# Patient Record
Sex: Male | Born: 1940 | Race: White | Hispanic: No | Marital: Married | Smoking: Never smoker
Health system: Southern US, Community
[De-identification: ages and names within clinical notes are randomized; demographics above are authoritative.]

## PROBLEM LIST (undated history)

## (undated) DIAGNOSIS — E119 Type 2 diabetes mellitus without complications: Secondary | ICD-10-CM

## (undated) DIAGNOSIS — I10 Essential (primary) hypertension: Secondary | ICD-10-CM

## (undated) DIAGNOSIS — J449 Chronic obstructive pulmonary disease, unspecified: Secondary | ICD-10-CM

## (undated) DIAGNOSIS — N189 Chronic kidney disease, unspecified: Secondary | ICD-10-CM

## (undated) DIAGNOSIS — M1990 Unspecified osteoarthritis, unspecified site: Secondary | ICD-10-CM

## (undated) DIAGNOSIS — E785 Hyperlipidemia, unspecified: Secondary | ICD-10-CM

## (undated) DIAGNOSIS — D649 Anemia, unspecified: Secondary | ICD-10-CM

## (undated) DIAGNOSIS — I739 Peripheral vascular disease, unspecified: Secondary | ICD-10-CM

## (undated) HISTORY — PX: HIP FUSION: SHX669

## (undated) HISTORY — PX: LUMBAR FUSION: SHX111

---

## 2003-07-23 ENCOUNTER — Other Ambulatory Visit: Payer: Self-pay

## 2004-04-26 ENCOUNTER — Other Ambulatory Visit: Payer: Self-pay

## 2009-06-13 ENCOUNTER — Ambulatory Visit: Payer: Self-pay | Admitting: Internal Medicine

## 2011-08-05 ENCOUNTER — Ambulatory Visit: Payer: Self-pay | Admitting: Internal Medicine

## 2012-11-17 ENCOUNTER — Ambulatory Visit: Payer: Self-pay | Admitting: Internal Medicine

## 2014-01-27 ENCOUNTER — Inpatient Hospital Stay: Payer: Self-pay | Admitting: Internal Medicine

## 2014-01-27 LAB — COMPREHENSIVE METABOLIC PANEL
ALBUMIN: 3 g/dL — AB (ref 3.4–5.0)
ALT: 18 U/L (ref 12–78)
ANION GAP: 9 (ref 7–16)
Alkaline Phosphatase: 96 U/L
BUN: 31 mg/dL — AB (ref 7–18)
Bilirubin,Total: 1.4 mg/dL — ABNORMAL HIGH (ref 0.2–1.0)
CHLORIDE: 100 mmol/L (ref 98–107)
CREATININE: 2.63 mg/dL — AB (ref 0.60–1.30)
Calcium, Total: 8.7 mg/dL (ref 8.5–10.1)
Co2: 24 mmol/L (ref 21–32)
EGFR (African American): 27 — ABNORMAL LOW
GFR CALC NON AF AMER: 23 — AB
Glucose: 98 mg/dL (ref 65–99)
OSMOLALITY: 273 (ref 275–301)
Potassium: 3.5 mmol/L (ref 3.5–5.1)
SGOT(AST): 27 U/L (ref 15–37)
Sodium: 133 mmol/L — ABNORMAL LOW (ref 136–145)
Total Protein: 7.7 g/dL (ref 6.4–8.2)

## 2014-01-27 LAB — URINALYSIS, COMPLETE
BLOOD: NEGATIVE
Bacteria: NONE SEEN
Bilirubin,UR: NEGATIVE
Glucose,UR: NEGATIVE mg/dL (ref 0–75)
Hyaline Cast: 7
Ketone: NEGATIVE
LEUKOCYTE ESTERASE: NEGATIVE
NITRITE: NEGATIVE
Ph: 5 (ref 4.5–8.0)
Protein: 30
RBC,UR: 5 /HPF (ref 0–5)
Specific Gravity: 1.021 (ref 1.003–1.030)
Squamous Epithelial: NONE SEEN
WBC UR: 1 /HPF (ref 0–5)

## 2014-01-27 LAB — CBC
HCT: 38 % — AB (ref 40.0–52.0)
HGB: 12.4 g/dL — AB (ref 13.0–18.0)
MCH: 29 pg (ref 26.0–34.0)
MCHC: 32.7 g/dL (ref 32.0–36.0)
MCV: 89 fL (ref 80–100)
Platelet: 203 10*3/uL (ref 150–440)
RBC: 4.28 10*6/uL — ABNORMAL LOW (ref 4.40–5.90)
RDW: 16 % — ABNORMAL HIGH (ref 11.5–14.5)
WBC: 24.5 10*3/uL — AB (ref 3.8–10.6)

## 2014-01-27 LAB — TROPONIN I: TROPONIN-I: 0.03 ng/mL

## 2014-01-28 LAB — CBC WITH DIFFERENTIAL/PLATELET
BASOS PCT: 0.3 %
Basophil #: 0.1 10*3/uL (ref 0.0–0.1)
EOS PCT: 2.6 %
Eosinophil #: 0.5 10*3/uL (ref 0.0–0.7)
HCT: 34 % — ABNORMAL LOW (ref 40.0–52.0)
HGB: 11.1 g/dL — AB (ref 13.0–18.0)
LYMPHS PCT: 7.1 %
Lymphocyte #: 1.4 10*3/uL (ref 1.0–3.6)
MCH: 29.1 pg (ref 26.0–34.0)
MCHC: 32.7 g/dL (ref 32.0–36.0)
MCV: 89 fL (ref 80–100)
MONO ABS: 2.1 x10 3/mm — AB (ref 0.2–1.0)
Monocyte %: 10.6 %
NEUTROS PCT: 79.4 %
Neutrophil #: 16 10*3/uL — ABNORMAL HIGH (ref 1.4–6.5)
PLATELETS: 157 10*3/uL (ref 150–440)
RBC: 3.82 10*6/uL — ABNORMAL LOW (ref 4.40–5.90)
RDW: 15.9 % — AB (ref 11.5–14.5)
WBC: 20.1 10*3/uL — ABNORMAL HIGH (ref 3.8–10.6)

## 2014-01-28 LAB — BASIC METABOLIC PANEL
ANION GAP: 7 (ref 7–16)
BUN: 37 mg/dL — ABNORMAL HIGH (ref 7–18)
CALCIUM: 7.9 mg/dL — AB (ref 8.5–10.1)
CHLORIDE: 103 mmol/L (ref 98–107)
CO2: 24 mmol/L (ref 21–32)
Creatinine: 2.85 mg/dL — ABNORMAL HIGH (ref 0.60–1.30)
EGFR (African American): 24 — ABNORMAL LOW
EGFR (Non-African Amer.): 21 — ABNORMAL LOW
Glucose: 119 mg/dL — ABNORMAL HIGH (ref 65–99)
OSMOLALITY: 278 (ref 275–301)
POTASSIUM: 3.4 mmol/L — AB (ref 3.5–5.1)
SODIUM: 134 mmol/L — AB (ref 136–145)

## 2014-01-29 LAB — BASIC METABOLIC PANEL
Anion Gap: 7 (ref 7–16)
BUN: 39 mg/dL — AB (ref 7–18)
CALCIUM: 7.9 mg/dL — AB (ref 8.5–10.1)
Chloride: 103 mmol/L (ref 98–107)
Co2: 24 mmol/L (ref 21–32)
Creatinine: 2.5 mg/dL — ABNORMAL HIGH (ref 0.60–1.30)
GFR CALC AF AMER: 28 — AB
GFR CALC NON AF AMER: 25 — AB
GLUCOSE: 77 mg/dL (ref 65–99)
OSMOLALITY: 276 (ref 275–301)
POTASSIUM: 4.1 mmol/L (ref 3.5–5.1)
Sodium: 134 mmol/L — ABNORMAL LOW (ref 136–145)

## 2014-01-29 LAB — CBC WITH DIFFERENTIAL/PLATELET
Bands: 1 %
Comment - H1-Com2: NORMAL
EOS PCT: 17 %
HCT: 33.4 % — ABNORMAL LOW (ref 40.0–52.0)
HGB: 10.8 g/dL — ABNORMAL LOW (ref 13.0–18.0)
LYMPHS PCT: 9 %
MCH: 29.2 pg (ref 26.0–34.0)
MCHC: 32.5 g/dL (ref 32.0–36.0)
MCV: 90 fL (ref 80–100)
METAMYELOCYTE: 1 %
MONOS PCT: 7 %
Platelet: 161 10*3/uL (ref 150–440)
RBC: 3.72 10*6/uL — ABNORMAL LOW (ref 4.40–5.90)
RDW: 15.9 % — ABNORMAL HIGH (ref 11.5–14.5)
Segmented Neutrophils: 65 %
WBC: 13.5 10*3/uL — ABNORMAL HIGH (ref 3.8–10.6)

## 2014-01-30 LAB — BASIC METABOLIC PANEL
Anion Gap: 10 (ref 7–16)
BUN: 36 mg/dL — AB (ref 7–18)
Calcium, Total: 8.1 mg/dL — ABNORMAL LOW (ref 8.5–10.1)
Chloride: 105 mmol/L (ref 98–107)
Co2: 21 mmol/L (ref 21–32)
Creatinine: 2.48 mg/dL — ABNORMAL HIGH (ref 0.60–1.30)
EGFR (African American): 29 — ABNORMAL LOW
EGFR (Non-African Amer.): 25 — ABNORMAL LOW
Glucose: 64 mg/dL — ABNORMAL LOW (ref 65–99)
Osmolality: 278 (ref 275–301)
POTASSIUM: 4.6 mmol/L (ref 3.5–5.1)
SODIUM: 136 mmol/L (ref 136–145)

## 2014-01-30 LAB — CBC WITH DIFFERENTIAL/PLATELET
Basophil #: 0 10*3/uL (ref 0.0–0.1)
Basophil %: 0.5 %
EOS PCT: 20.5 %
Eosinophil #: 2 10*3/uL — ABNORMAL HIGH (ref 0.0–0.7)
HCT: 33.1 % — ABNORMAL LOW (ref 40.0–52.0)
HGB: 10.7 g/dL — AB (ref 13.0–18.0)
Lymphocyte #: 0.9 10*3/uL — ABNORMAL LOW (ref 1.0–3.6)
Lymphocyte %: 8.7 %
MCH: 29.1 pg (ref 26.0–34.0)
MCHC: 32.3 g/dL (ref 32.0–36.0)
MCV: 90 fL (ref 80–100)
MONOS PCT: 10.2 %
Monocyte #: 1 x10 3/mm (ref 0.2–1.0)
Neutrophil #: 5.9 10*3/uL (ref 1.4–6.5)
Neutrophil %: 60.1 %
PLATELETS: 153 10*3/uL (ref 150–440)
RBC: 3.67 10*6/uL — ABNORMAL LOW (ref 4.40–5.90)
RDW: 16.1 % — ABNORMAL HIGH (ref 11.5–14.5)
WBC: 9.8 10*3/uL (ref 3.8–10.6)

## 2014-01-31 LAB — BASIC METABOLIC PANEL
ANION GAP: 7 (ref 7–16)
BUN: 32 mg/dL — AB (ref 7–18)
CHLORIDE: 108 mmol/L — AB (ref 98–107)
CO2: 23 mmol/L (ref 21–32)
Calcium, Total: 8.4 mg/dL — ABNORMAL LOW (ref 8.5–10.1)
Creatinine: 2.09 mg/dL — ABNORMAL HIGH (ref 0.60–1.30)
EGFR (African American): 35 — ABNORMAL LOW
EGFR (Non-African Amer.): 30 — ABNORMAL LOW
Glucose: 97 mg/dL (ref 65–99)
OSMOLALITY: 282 (ref 275–301)
POTASSIUM: 4.4 mmol/L (ref 3.5–5.1)
Sodium: 138 mmol/L (ref 136–145)

## 2014-01-31 LAB — CBC WITH DIFFERENTIAL/PLATELET
Basophil #: 0.1 10*3/uL (ref 0.0–0.1)
Basophil %: 1.1 %
EOS ABS: 1.9 10*3/uL — AB (ref 0.0–0.7)
Eosinophil %: 21.8 %
HCT: 31.7 % — ABNORMAL LOW (ref 40.0–52.0)
HGB: 10.5 g/dL — ABNORMAL LOW (ref 13.0–18.0)
LYMPHS PCT: 11.4 %
Lymphocyte #: 1 10*3/uL (ref 1.0–3.6)
MCH: 29.6 pg (ref 26.0–34.0)
MCHC: 33.2 g/dL (ref 32.0–36.0)
MCV: 89 fL (ref 80–100)
MONO ABS: 1 x10 3/mm (ref 0.2–1.0)
MONOS PCT: 11.5 %
NEUTROS ABS: 4.8 10*3/uL (ref 1.4–6.5)
Neutrophil %: 54.2 %
Platelet: 163 10*3/uL (ref 150–440)
RBC: 3.54 10*6/uL — AB (ref 4.40–5.90)
RDW: 16 % — ABNORMAL HIGH (ref 11.5–14.5)
WBC: 8.8 10*3/uL (ref 3.8–10.6)

## 2014-02-01 LAB — BASIC METABOLIC PANEL
ANION GAP: 6 — AB (ref 7–16)
BUN: 25 mg/dL — ABNORMAL HIGH (ref 7–18)
CALCIUM: 8.8 mg/dL (ref 8.5–10.1)
Chloride: 110 mmol/L — ABNORMAL HIGH (ref 98–107)
Co2: 23 mmol/L (ref 21–32)
Creatinine: 1.83 mg/dL — ABNORMAL HIGH (ref 0.60–1.30)
EGFR (African American): 41 — ABNORMAL LOW
GFR CALC NON AF AMER: 36 — AB
GLUCOSE: 104 mg/dL — AB (ref 65–99)
OSMOLALITY: 282 (ref 275–301)
POTASSIUM: 4.3 mmol/L (ref 3.5–5.1)
SODIUM: 139 mmol/L (ref 136–145)

## 2014-02-01 LAB — CBC WITH DIFFERENTIAL/PLATELET
Bands: 3 %
EOS PCT: 20 %
HCT: 31.7 % — AB (ref 40.0–52.0)
HGB: 10.3 g/dL — AB (ref 13.0–18.0)
LYMPHS PCT: 14 %
MCH: 29.4 pg (ref 26.0–34.0)
MCHC: 32.6 g/dL (ref 32.0–36.0)
MCV: 90 fL (ref 80–100)
Monocytes: 8 %
Myelocyte: 2 %
Platelet: 201 10*3/uL (ref 150–440)
RBC: 3.52 10*6/uL — ABNORMAL LOW (ref 4.40–5.90)
RDW: 15.9 % — ABNORMAL HIGH (ref 11.5–14.5)
SEGMENTED NEUTROPHILS: 53 %
WBC: 9.6 10*3/uL (ref 3.8–10.6)

## 2014-02-01 LAB — CULTURE, BLOOD (SINGLE)

## 2014-02-02 LAB — BASIC METABOLIC PANEL
ANION GAP: 6 — AB (ref 7–16)
BUN: 21 mg/dL — AB (ref 7–18)
CHLORIDE: 109 mmol/L — AB (ref 98–107)
CO2: 24 mmol/L (ref 21–32)
Calcium, Total: 9.2 mg/dL (ref 8.5–10.1)
Creatinine: 1.76 mg/dL — ABNORMAL HIGH (ref 0.60–1.30)
EGFR (African American): 43 — ABNORMAL LOW
EGFR (Non-African Amer.): 38 — ABNORMAL LOW
Glucose: 100 mg/dL — ABNORMAL HIGH (ref 65–99)
Osmolality: 281 (ref 275–301)
POTASSIUM: 4.2 mmol/L (ref 3.5–5.1)
Sodium: 139 mmol/L (ref 136–145)

## 2014-02-02 LAB — CBC WITH DIFFERENTIAL/PLATELET
Bands: 1 %
Comment - H1-Com2: NORMAL
Eosinophil: 29 %
HCT: 33.5 % — ABNORMAL LOW (ref 40.0–52.0)
HGB: 10.9 g/dL — ABNORMAL LOW (ref 13.0–18.0)
Lymphocytes: 18 %
MCH: 29.1 pg (ref 26.0–34.0)
MCHC: 32.4 g/dL (ref 32.0–36.0)
MCV: 90 fL (ref 80–100)
METAMYELOCYTE: 1 %
MONOS PCT: 7 %
PLATELETS: 253 10*3/uL (ref 150–440)
RBC: 3.73 10*6/uL — AB (ref 4.40–5.90)
RDW: 16.1 % — ABNORMAL HIGH (ref 11.5–14.5)
SEGMENTED NEUTROPHILS: 44 %
WBC: 10.4 10*3/uL (ref 3.8–10.6)

## 2014-11-08 ENCOUNTER — Ambulatory Visit: Payer: Self-pay | Admitting: Internal Medicine

## 2015-01-06 NOTE — H&P (Signed)
PATIENT NAME:  Tyler Bowers, Tyler Bowers MR#:  [id number] DATE OF BIRTH:  [birth year]  DATE OF ADMISSION:  01/27/2014  PRIMARY CARE PHYSICIAN: Dr. Aram BeechamJeffrey Sparks.   CHIEF COMPLAINT: Status post fall and back pain.   HISTORY OF PRESENT ILLNESS: This is a 74 year old male who presented to the hospital after he had a fall earlier at home today and was having some lower back pain. The patient says that he also gets physical therapy as an outpatient for gait training. He went there yesterday and when he was returning from there, he felt sick to his stomach and had some nausea, vomiting. He has not had any since then, but he has had poor appetite now for the past couple of days. He then have a fall earlier this morning, which was likely a mechanical fall. He was also complaining of some shortness of breath; therefore, his family brought him to the Emergency Room. In the Emergency Room, the patient was noted to have a leukocytosis and noted to be in acute renal failure. His CT chest was suggestive of a left lower lobe pneumonia. Hospitalist services are contacted for further treatment and evaluation.   REVIEW OF SYSTEMS: GENERAL: No documented fever. Positive fatigue. No weight gain. No weight loss.  EYES: No blurred or double vision.  EARS, NOSE, THROAT: No tinnitus. No postnasal drip. No redness of the oropharynx.  RESPIRATORY: Positive cough, productive of clear sputum. No wheeze. No hemoptysis. Positive dyspnea.  CARDIOVASCULAR: No chest pain. No orthopnea. No palpitations. No syncope.  GASTROINTESTINAL: Positive nausea and vomiting. No diarrhea. No abdominal pain. No melena or hematochezia.  GENITOURINARY: No dysuria or hematuria.  ENDOCRINE: No polyuria, nocturia, heat or cold intolerance.  HEMATOLOGIC: No anemia. No bruising. No bleeding.  INTEGUMENTARY: No rashes. No lesions.  MUSCULOSKELETAL: No arthritis, no swelling, no gout.  NEUROLOGIC: No numbness. No tingling. No ataxia. No seizure-type activity.   PSYCHIATRIC: No anxiety. No insomnia. No ADD.   PAST MEDICAL HISTORY: Consistent with diabetes, hypertension, COPD, GERD, hyperlipidemia, BPH, history of gout.   ALLERGIES: No known drug allergies.   SOCIAL HISTORY: Used to be a smoker, quit about 8 years ago, did smoke for the past 40 to 50 years. Occasional alcohol use. No illicit drug abuse. Lives at home with his wife.   FAMILY HISTORY: The patient's mother and father are both deceased. Father died from an MI. Mother died from complications of diabetes.   CURRENT MEDICATIONS: Tylenol 650 at bedtime as needed, allopurinol 100 mg daily, Coreg 12.5 mg b.i.d., chlorthalidone 25 mg daily, cholecalciferol 2000 international units daily, Plavix 75 mg daily, iron sulfate 325 mg b.i.d., glipizide 10 mg b.i.d., hydralazine 100 mg t.i.d., ipratropium nasal spray 2 sprays to each nostril daily at bedtime, omeprazole 20 mg daily, Pravachol 20 mg daily, and terazosin 2 mg at bedtime.   PHYSICAL EXAMINATION: Presently is as follows:  VITAL SIGNS: Temperature 99.4, pulse 82, respirations 20, blood pressure 109/52, sats 92% on room air.  GENERAL: He is a pleasant-appearing male in no apparent distress.  HEAD, EYES, EARS, NOSE, AND THROAT: Atraumatic, normocephalic. Extraocular muscles are intact. Pupils equal and reactive to light. Sclerae anicteric. No conjunctival injection. No pharyngeal erythema.  NECK: Supple. There is no jugular venous distention. No bruits, no lymphadenopathy, no thyromegaly.  HEART: Regular rate and rhythm, tachycardic. No murmurs, no rubs, no clicks.  LUNGS: He has some coarse rhonchi and end-expiratory wheezing, but negative use of accessory muscles. No dullness to percussion. No rales.  ABDOMEN: Soft,  flat, nontender, nondistended. Has good bowel sounds. No hepatosplenomegaly appreciated.  EXTREMITIES: No evidence of any cyanosis, clubbing, or peripheral edema. Has +2 pedal and radial pulses bilaterally.  NEUROLOGICAL: The  patient is alert, awake, and oriented x 3 with no focal motor or sensory deficits appreciated bilaterally.  SKIN: Moist and warm with no rashes appreciated.  LYMPHATIC: There is no cervical or axillary lymphadenopathy.   LABORATORY AND RADIOLOGICAL DATA: Serum glucose of 98, BUN 31, creatinine 2.6, sodium 133, potassium 3.5, chloride 100, bicarbonate 24. Albumin is 3.0, LFTs within normal limits. Troponin 0.03. White cell count 24.5, hemoglobin 12.4, hematocrit 38, platelet count 203. The patient's urinalysis is within normal limits.   The patient did have a chest x-ray done, which showed stable chronic interstitial coarsening, no acute cardiopulmonary disease, low lung volumes. X-ray of the lumbar spine shows  mild anterior wedging of the L1-L2 vertebral bodies, osteoarthritic changes at L5-S1, no spondylolisthesis. The patient also had a CT scan of the chest done without contrast, which showed a dense left lower lobe consolidation suggesting pneumonia given the history of shortness of breath. Given the presence of subpleural reticular scarring elsewhere, round atelectasis is theoretically possible but less likely. Bilateral pulmonary nodules as above, one of which is superior segment of right lower lobe, could be associated with scarring. If the patient is at high risk for bronchogenic carcinoma, a followup CT chest at 3 to 6 months is recommended.   ASSESSMENT AND PLAN: This is a 74 year old male with a history of chronic obstructive pulmonary disease, diabetes, hypertension, gastroesophageal reflux disease, hyperlipidemia, benign prostatic hypertrophy, history of gout, presented to the hospital due to a fall and back pain, noted to have a leukocytosis with acute renal failure and left lower lobe pneumonia.  1.  Left lower lobe pneumonia: This is likely the cause of the patient's shortness of breath. This is likely community-acquired pneumonia. I will treat the patient with IV ceftriaxone and Zithromax,  follow sputum and blood cultures, and follow him clinically.  2.  Acute renal failure: Likely secondary to dehydration and poor p.o. intake. I will hydrate him with IV fluids, follow BUN and creatinine, hold his chlorthalidone.  3.  Leukocytosis: I suspect this is secondary to the pneumonia. I will follow his white cell count after IV antibiotic therapy.  4.  Back pain status post fall: The patient's x-ray shows no evidence of acute fracture. Continue supportive care with pain control with some p.r.n. Norco.  5.  Benign prostatic hypertrophy: Continue terazosin. There is no urinary retention or obstruction.  6.  Hypertension: The patient is hemodynamically stable. I will continue his hydralazine and Coreg.  7.  Hyperlipidemia: Continue with his Pravachol.  8.  Gout: No acute attack. Hold his allopurinol given his acute renal failure. 9.  Gastroesophageal reflux disease: Continue Protonix.  10.  History of peripheral vascular disease: Continue with his statin and Plavix.  11.  Diabetes: Continue with his glipizide and a carbohydrate-controlled diet. Follow blood sugars.   CODE STATUS: The patient is a full code.   TIME SPENT: 50 minutes.    ____________________________ Rolly Pancake. Cherlynn Kaiser, MD vjs:jcm D: 01/27/2014 19:02:54 ET T: 01/27/2014 19:38:31 ET JOB#: [id number]  cc: Rolly Pancake. Cherlynn Kaiser, MD, <Dictator> Houston Siren MD ELECTRONICALLY SIGNED 02/08/2014 15:08

## 2015-01-06 NOTE — Discharge Summary (Signed)
PATIENT NAME:  Tyler Bowers, [name] MR#:  [id number] DATE OF BIRTH:  Jul 27, 1941  DATE OF ADMISSION:  01/27/2014 DATE OF DISCHARGE:  02/02/2014  REASON FOR ADMISSION: Fall with back pain.   HISTORY OF PRESENT ILLNESS: Please see the dictated HPI done by Dr. Cherlynn KaiserSainani on 01/27/2014.   PAST MEDICAL HISTORY:  1. Peripheral vascular disease.  2. Type 2 diabetes.  3. Benign hypertension.  4. COPD.  5. GE reflux disease.  6. Hyperlipidemia.  7. Gout.  8. BPH.   MEDICATIONS ON ADMISSION: Please see admission note.   ALLERGIES: No known drug allergies.   SOCIAL HISTORY, FAMILY HISTORY AND REVIEW OF SYSTEMS: As per admission note.   PHYSICAL EXAMINATION:  GENERAL: The patient was in no acute distress.  VITAL SIGNS: Remarkable for a blood pressure of 109/52 with a temperature of 99.4.  HEENT: Unremarkable.  NECK: Supple, without JVD.  LUNGS: Revealed diffuse wheezes and rhonchi.  CARDIAC: Revealed a regular rate and rhythm. Normal S1, S2.  ABDOMEN: Soft and nontender.  EXTREMITIES: Without edema.  NEUROLOGIC: Grossly nonfocal.   HOSPITAL COURSE: The patient was admitted with pneumonia associated with acute renal failure and dehydration. The patient was placed on IV fluids with IV antibiotics with oxygen and SVNs. His leukocytosis improved. He continued to have back pain. No acute fracture on plain films. He was seen by physical therapy. The patient improved with conservative measures. He was weaned off oxygen. He was switched to oral antibiotics and continued to do well. By 02/02/2014, the patient was stable and ready for discharge.   DISCHARGE DIAGNOSES:  1. Community-acquired pneumonia.  2. Chronic anemia.  3. Acute on chronic renal failure.  4. Dehydration.  5. Type 2 diabetes.  6. Chronic obstructive pulmonary disease.  7. Degenerative disk disease with back pain.   DISCHARGE MEDICATIONS:  1. Chlorthalidone 25 mg p.o. daily.  2. Terazosin 10 mg p.o. at bedtime.  3. Glipizide 10  mg p.o. daily.  4. Pravastatin 20 mg p.o. at bedtime.  5. Allopurinol 100 mg p.o. daily.  6. Plavix 75 mg p.o. daily.  7. Coreg 12.5 mg p.o. b.i.d.  8. Ceftin 250 mg p.o. b.i.d. for 10 days.  9. Iron sulfate 325 mg p.o. b.i.d.  10. Zithromax 250 mg p.o. daily for 5 days.  11. Protonix 40 mg p.o. daily.  12. Atrovent nasal spray 2 puffs in each nostril at bedtime.  13. Hydralazine 50 mg p.o. b.i.d.  14. Vitamin D 2000 units p.o. daily.   FOLLOW-UP PLANS AND APPOINTMENTS: The patient was discharged on a low-sodium, carbohydrate-controlled diet. He will follow up with me within 1 week's time, sooner if needed.   ____________________________ Duane LopeJeffrey D. Judithann SheenSparks, MD jds:lb D: 02/17/2014 12:38:52 ET T: 02/17/2014 13:04:40 ET JOB#: [id number]  cc: Duane LopeJeffrey D. Judithann SheenSparks, MD, <Dictator> Janeane Cozart Rodena Medin Miela Desjardin MD ELECTRONICALLY SIGNED 02/17/2014 22:18

## 2015-02-06 ENCOUNTER — Encounter: Payer: Self-pay | Admitting: Emergency Medicine

## 2015-02-06 ENCOUNTER — Emergency Department
Admission: EM | Admit: 2015-02-06 | Discharge: 2015-02-06 | Payer: Medicare Other | Attending: Emergency Medicine | Admitting: Emergency Medicine

## 2015-02-06 ENCOUNTER — Emergency Department: Payer: Medicare Other

## 2015-02-06 DIAGNOSIS — Y9389 Activity, other specified: Secondary | ICD-10-CM | POA: Diagnosis not present

## 2015-02-06 DIAGNOSIS — I10 Essential (primary) hypertension: Secondary | ICD-10-CM | POA: Insufficient documentation

## 2015-02-06 DIAGNOSIS — Z9889 Other specified postprocedural states: Secondary | ICD-10-CM | POA: Insufficient documentation

## 2015-02-06 DIAGNOSIS — Y9289 Other specified places as the place of occurrence of the external cause: Secondary | ICD-10-CM | POA: Diagnosis not present

## 2015-02-06 DIAGNOSIS — S7002XA Contusion of left hip, initial encounter: Secondary | ICD-10-CM | POA: Insufficient documentation

## 2015-02-06 DIAGNOSIS — Y998 Other external cause status: Secondary | ICD-10-CM | POA: Insufficient documentation

## 2015-02-06 DIAGNOSIS — W010XXA Fall on same level from slipping, tripping and stumbling without subsequent striking against object, initial encounter: Secondary | ICD-10-CM | POA: Diagnosis not present

## 2015-02-06 DIAGNOSIS — W19XXXA Unspecified fall, initial encounter: Secondary | ICD-10-CM

## 2015-02-06 DIAGNOSIS — S79912A Unspecified injury of left hip, initial encounter: Secondary | ICD-10-CM | POA: Diagnosis present

## 2015-02-06 DIAGNOSIS — E119 Type 2 diabetes mellitus without complications: Secondary | ICD-10-CM | POA: Diagnosis not present

## 2015-02-06 HISTORY — DX: Essential (primary) hypertension: I10

## 2015-02-06 HISTORY — DX: Unspecified osteoarthritis, unspecified site: M19.90

## 2015-02-06 HISTORY — DX: Type 2 diabetes mellitus without complications: E11.9

## 2015-02-06 LAB — COMPREHENSIVE METABOLIC PANEL
ALT: 19 U/L (ref 17–63)
AST: 29 U/L (ref 15–41)
Albumin: 3.7 g/dL (ref 3.5–5.0)
Alkaline Phosphatase: 99 U/L (ref 38–126)
Anion gap: 10 (ref 5–15)
BUN: 22 mg/dL — ABNORMAL HIGH (ref 6–20)
CO2: 25 mmol/L (ref 22–32)
Calcium: 8.9 mg/dL (ref 8.9–10.3)
Chloride: 104 mmol/L (ref 101–111)
Creatinine, Ser: 2.06 mg/dL — ABNORMAL HIGH (ref 0.61–1.24)
GFR calc Af Amer: 35 mL/min — ABNORMAL LOW (ref >60)
GFR, EST NON AFRICAN AMERICAN: 30 mL/min — AB (ref >60)
Glucose, Bld: 218 mg/dL — ABNORMAL HIGH (ref 65–99)
Potassium: 4 mmol/L (ref 3.5–5.1)
SODIUM: 139 mmol/L (ref 135–145)
Total Bilirubin: 0.2 mg/dL — ABNORMAL LOW (ref 0.3–1.2)
Total Protein: 7.7 g/dL (ref 6.5–8.1)

## 2015-02-06 LAB — CBC WITH DIFFERENTIAL/PLATELET
Basophils Absolute: 0.1 10*3/uL (ref 0–0.1)
Basophils Relative: 1 %
EOS PCT: 33 %
Eosinophils Absolute: 3.2 10*3/uL — ABNORMAL HIGH (ref 0–0.7)
HCT: 38.6 % — ABNORMAL LOW (ref 40.0–52.0)
Hemoglobin: 12.7 g/dL — ABNORMAL LOW (ref 13.0–18.0)
LYMPHS ABS: 1.2 10*3/uL (ref 1.0–3.6)
LYMPHS PCT: 12 %
MCH: 30 pg (ref 26.0–34.0)
MCHC: 32.9 g/dL (ref 32.0–36.0)
MCV: 91.2 fL (ref 80.0–100.0)
MONOS PCT: 7 %
Monocytes Absolute: 0.7 10*3/uL (ref 0.2–1.0)
Neutro Abs: 4.6 10*3/uL (ref 1.4–6.5)
Neutrophils Relative %: 47 %
Platelets: 232 10*3/uL (ref 150–440)
RBC: 4.23 MIL/uL — ABNORMAL LOW (ref 4.40–5.90)
RDW: 15.8 % — AB (ref 11.5–14.5)
WBC: 9.8 10*3/uL (ref 3.8–10.6)

## 2015-02-06 MED ORDER — PROMETHAZINE HCL 25 MG/ML IJ SOLN
25.0000 mg | Freq: Four times a day (QID) | INTRAMUSCULAR | Status: DC | PRN
  Administered 2015-02-06: 25 mg via INTRAVENOUS

## 2015-02-06 MED ORDER — HYDROMORPHONE HCL 1 MG/ML IJ SOLN
INTRAMUSCULAR | Status: AC
  Administered 2015-02-06: 0.5 mg via INTRAVENOUS
  Filled 2015-02-06: qty 1

## 2015-02-06 MED ORDER — OXYCODONE-ACETAMINOPHEN 5-325 MG PO TABS: 1.0000 | ORAL_TABLET | Freq: Four times a day (QID) | ORAL | Status: AC | PRN

## 2015-02-06 MED ORDER — SODIUM CHLORIDE 0.9 % IV SOLN
Freq: Once | INTRAVENOUS | Status: AC
  Administered 2015-02-06: 10:00:00 via INTRAVENOUS

## 2015-02-06 MED ORDER — FENTANYL CITRATE (PF) 100 MCG/2ML IJ SOLN
INTRAMUSCULAR | Status: AC
  Administered 2015-02-06: 50 ug
  Filled 2015-02-06: qty 2

## 2015-02-06 MED ORDER — ONDANSETRON HCL 4 MG/2ML IJ SOLN
INTRAMUSCULAR | Status: AC
Start: 2015-02-06 — End: 2015-02-06
  Administered 2015-02-06: 4 mg via INTRAVENOUS
  Filled 2015-02-06: qty 2

## 2015-02-06 MED ORDER — HYDROMORPHONE HCL 1 MG/ML IJ SOLN
0.5000 mg | Freq: Once | INTRAMUSCULAR | Status: AC
  Administered 2015-02-06: 09:00:00 via INTRAVENOUS

## 2015-02-06 MED ORDER — ONDANSETRON HCL 4 MG/2ML IJ SOLN
INTRAMUSCULAR | Status: AC
  Administered 2015-02-06: 4 mg via INTRAVENOUS
  Filled 2015-02-06: qty 2

## 2015-02-06 MED ORDER — HYDROMORPHONE HCL 1 MG/ML PO LIQD: 0.5000 mg | Freq: Once | ORAL | Status: DC

## 2015-02-06 MED ORDER — HYDROMORPHONE HCL 1 MG/ML IJ SOLN
1.0000 mg | Freq: Once | INTRAMUSCULAR | Status: AC
  Administered 2015-02-06: 0.5 mg via INTRAVENOUS

## 2015-02-06 MED ORDER — PROMETHAZINE HCL 25 MG/ML IJ SOLN
INTRAMUSCULAR | Status: AC
  Administered 2015-02-06: 25 mg via INTRAVENOUS
  Filled 2015-02-06: qty 1

## 2015-02-06 MED ORDER — ONDANSETRON HCL 4 MG/2ML IJ SOLN
4.0000 mg | Freq: Once | INTRAMUSCULAR | Status: AC
  Administered 2015-02-06: 4 mg via INTRAVENOUS

## 2015-02-06 NOTE — ED Notes (Signed)
Pt fell today and landed on left hip, hx of hip replacement

## 2015-02-06 NOTE — Discharge Instructions (Signed)
Fall Prevention and Home Safety Falls cause injuries and can affect all age groups. It is possible to use preventive measures to significantly decrease the likelihood of falls. There are many simple measures which can make your home safer and prevent falls. OUTDOORS  Repair cracks and edges of walkways and driveways.  Remove high doorway thresholds.  Trim shrubbery on the main path into your home.  Have good outside lighting.  Clear walkways of tools, rocks, debris, and clutter.  Check that handrails are not broken and are securely fastened. Both sides of steps should have handrails.  Have leaves, snow, and ice cleared regularly.  Use sand or salt on walkways during winter months.  In the garage, clean up grease or oil spills. BATHROOM  Install night lights.  Install grab bars by the toilet and in the tub and shower.  Use non-skid mats or decals in the tub or shower.  Place a plastic non-slip stool in the shower to sit on, if needed.  Keep floors dry and clean up all water on the floor immediately.  Remove soap buildup in the tub or shower on a regular basis.  Secure bath mats with non-slip, double-sided rug tape.  Remove throw rugs and tripping hazards from the floors. BEDROOMS  Install night lights.  Make sure a bedside light is easy to reach.  Do not use oversized bedding.  Keep a telephone by your bedside.  Have a firm chair with side arms to use for getting dressed.  Remove throw rugs and tripping hazards from the floor. KITCHEN  Keep handles on pots and pans turned toward the center of the stove. Use back burners when possible.  Clean up spills quickly and allow time for drying.  Avoid walking on wet floors.  Avoid hot utensils and knives.  Position shelves so they are not too high or low.  Place commonly used objects within easy reach.  If necessary, use a sturdy step stool with a grab bar when reaching.  Keep electrical cables out of the  way.  Do not use floor polish or wax that makes floors slippery. If you must use wax, use non-skid floor wax.  Remove throw rugs and tripping hazards from the floor. STAIRWAYS  Never leave objects on stairs.  Place handrails on both sides of stairways and use them. Fix any loose handrails. Make sure handrails on both sides of the stairways are as long as the stairs.  Check carpeting to make sure it is firmly attached along stairs. Make repairs to worn or loose carpet promptly.  Avoid placing throw rugs at the top or bottom of stairways, or properly secure the rug with carpet tape to prevent slippage. Get rid of throw rugs, if possible.  Have an electrician put in a light switch at the top and bottom of the stairs. OTHER FALL PREVENTION TIPS  Wear low-heel or rubber-soled shoes that are supportive and fit well. Wear closed toe shoes.  When using a stepladder, make sure it is fully opened and both spreaders are firmly locked. Do not climb a closed stepladder.  Add color or contrast paint or tape to grab bars and handrails in your home. Place contrasting color strips on first and last steps.  Learn and use mobility aids as needed. Install an electrical emergency response system.  Turn on lights to avoid dark areas. Replace light bulbs that burn out immediately. Get light switches that glow.  Arrange furniture to create clear pathways. Keep furniture in the same place.  Firmly attach carpet with non-skid or double-sided tape.  Eliminate uneven floor surfaces.  Select a carpet pattern that does not visually hide the edge of steps.  Be aware of all pets. OTHER HOME SAFETY TIPS  Set the water temperature for 120 F (48.8 C).  Keep emergency numbers on or near the telephone.  Keep smoke detectors on every level of the home and near sleeping areas. Document Released: 08/22/2002 Document Revised: 03/02/2012 Document Reviewed: 11/21/2011 [facility] Patient Information 2015  [place]. This information is not intended to replace advice given to you by your health care provider. Make sure you discuss any questions you have with your health care provider.  Contusion A contusion is a deep bruise. Contusions happen when an injury causes bleeding under the skin. Signs of bruising include pain, puffiness (swelling), and discolored skin. The contusion may turn blue, purple, or yellow. HOME CARE   Put ice on the injured area.  Put ice in a plastic bag.  Place a towel between your skin and the bag.  Leave the ice on for 15-20 minutes, 03-04 times a day.  Only take medicine as told by your doctor.  Rest the injured area.  If possible, raise (elevate) the injured area to lessen puffiness. GET HELP RIGHT AWAY IF:   You have more bruising or puffiness.  You have pain that is getting worse.  Your puffiness or pain is not helped by medicine. MAKE SURE YOU:   Understand these instructions.  Will watch your condition.  Will get help right away if you are not doing well or get worse. Document Released: 02/18/2008 Document Revised: 11/24/2011 Document Reviewed: 07/07/2011 [facility] Patient Information 2015 [place], [place]. This information is not intended to replace advice given to you by your health care provider. Make sure you discuss any questions you have with your health care provider.

## 2015-02-06 NOTE — Care Management Note (Signed)
Case Management Note  Patient Details  Name: Tyler Bowers MRN: [id number] Date of Birth: [birth year]  Subjective/Objective:   Pt. Accepted to [facility] in transfer. Pt. Transport contacted by Licensed conveyancerunit secretary, awaiting same. Action/Plan:   Expected Discharge Date:                  Expected Discharge Plan:     In-House Referral:     Discharge planning Services     Post Acute Care Choice:    Choice offered to:     DME Arranged:    DME Agency:     HH Arranged:    HH Agency:     Status of Service:     Medicare Important Message Given:    Date Medicare IM Given:    Medicare IM give by:    Date Additional Medicare IM Given:    Additional Medicare Important Message give by:     If discussed at Long Length of Stay Meetings, dates discussed:    Additional Comments:  Berna BueCheryl Rhena Glace, RN 02/06/2015, 1:56 PM

## 2015-02-06 NOTE — ED Notes (Signed)
Spoke  With  [employer]rystal Buckner    AT  [facility]  ABOUT  TRANSFER  FAXED  ALL  [facility] PAPERWORK

## 2015-02-06 NOTE — ED Notes (Signed)
Patient transported to X-ray 

## 2015-02-06 NOTE — ED Notes (Signed)
Pt given meal tray.

## 2015-02-06 NOTE — ED Notes (Signed)
Patient back from x-ray 

## 2015-02-06 NOTE — ED Provider Notes (Addendum)
[facility] Emergency Department Provider Note     Time seen: ----------------------------------------- 8:04 AM on 02/06/2015 -----------------------------------------    I have reviewed the triage vital signs and the nursing notes.   HISTORY  Chief Complaint Fall    HPI Tyler Bowers is a 74 y.o. male who presents ER after he fell after tripping on a curb. He landed on his left hip. His history of the left hip replacement. Quite severe pain left hip any movement makes it worse.Fall occurred just prior to arrival.    Past Medical History  Diagnosis Date  . Hypertension   . Diabetes mellitus without complication   . Arthritis     There are no active problems to display for this patient.   Past Surgical History  Procedure Laterality Date  . Hip fusion    . Lumbar fusion      No current outpatient prescriptions on file.  Allergies Review of patient's allergies indicates no known allergies.  History reviewed. No pertinent family history.  Social History History  Substance Use Topics  . Smoking status: Never Smoker   . Smokeless tobacco: Not on file  . Alcohol Use: No    Review of Systems Constitutional: Negative for fever. Eyes: Negative for visual changes. ENT: Negative for sore throat. Cardiovascular: Negative for chest pain. Respiratory: Negative for shortness of breath. Gastrointestinal: Negative for abdominal pain, vomiting and diarrhea. Genitourinary: Negative for dysuria. Musculoskeletal: Left hip pain Skin: Negative for rash. Neurological: Negative for headaches, focal weakness or numbness.  10-point ROS otherwise negative.  ____________________________________________   PHYSICAL EXAM:  VITAL SIGNS: ED Triage Vitals  Enc Vitals Group     BP 02/06/15 0750 137/72 mmHg     Pulse Rate 02/06/15 0750 74     Resp 02/06/15 0750 20     Temp 02/06/15 0750 97.6 F (36.4 C)     Temp Source 02/06/15 0750 Oral   SpO2 02/06/15 0750 96 %     Weight 02/06/15 0750 237 lb (107.502 kg)     Height 02/06/15 0750 5\' 11"  (1.803 m)     Head Cir --      Peak Flow --      Pain Score 02/06/15 0750 8     Pain Loc --      Pain Edu? --      Excl. in GC? --     Constitutional: Alert and oriented. Well appearing and in no distress. Eyes: Conjunctivae are normal. PERRL. Normal extraocular movements. ENT   Head: Normocephalic and atraumatic.   Nose: No congestion/rhinnorhea.   Mouth/Throat: Mucous membranes are moist.   Neck: No stridor. Hematological/Lymphatic/Immunilogical: No cervical lymphadenopathy. Cardiovascular: Normal rate, regular rhythm. Normal and symmetric distal pulses are present in all extremities. No murmurs, rubs, or gallops. Respiratory: Normal respiratory effort without tachypnea nor retractions. Breath sounds are clear and equal bilaterally. No wheezes/rales/rhonchi. Gastrointestinal: Soft and nontender. No distention. No abdominal bruits. There is no CVA tenderness. Musculoskeletal: Tenderness over the left hip, significant pain with range of motion of left hip. Neurologic:  Normal speech and language. No gross focal neurologic deficits are appreciated. Speech is normal. No gait instability. Skin:  Skin is warm, dry and intact. No rash noted. Psychiatric: Mood and affect are normal. Speech and behavior are normal. Patient exhibits appropriate insight and judgment.  ____________________________________________    LABS (pertinent positives/negatives)  Labs Reviewed  COMPREHENSIVE METABOLIC PANEL - Abnormal; Notable for the following:    Glucose, Bld 218 (*)  BUN 22 (*)    Creatinine, Ser 2.06 (*)    Total Bilirubin 0.2 (*)    GFR calc non Af Amer 30 (*)    GFR calc Af Amer 35 (*)    All other components within normal limits  CBC WITH DIFFERENTIAL/PLATELET    ____________________________________________  ED COURSE:  Pertinent labs & imaging results that were  available during my care of the patient were reviewed by me and considered in my medical decision making (see chart for details).  We'll x-ray and provide pain control via IV Dilaudid. ____________________________________________   RADIOLOGY  Left hip x-ray  IMPRESSION: Total hip prosthesis on the left, well seated. No acute fracture or dislocation. Bones osteoporotic. ____________________________________________ IMPRESSION: 1. Acute periprosthetic fracture adjacent to the femoral prosthesis status post left total hip arthroplasty. The component extending into the greater trochanter is mildly displaced. 2. Potential underlying loosening of the proximal aspect of the femoral prosthesis. The prosthesis appears well seated distally. 3. Diffuse vascular calcifications.   FINAL ASSESSMENT AND PLAN  Fall, left hip pain, periprosthetic left hip fracture  Plan: Persistent pain noted here therefore CT of the left hip was obtained. Surprisingly revealed periprosthetic fracture adjacent to the femoral prosthesis as noted above. Patient is unable to ambulate, will need to be readmitted to the hospital. Discussed with Dr. Ernest Pine who advised weight-bearing as tolerated, he has reviewed the CT scan. Patient will need PT, [facility] paperwork has been sent. We are waiting to hear that he is accepting patients.    Emily Filbert, MD    Emily Filbert, MD 02/06/15 [phone]

## 2015-02-06 NOTE — Care Management Note (Signed)
Case Management Note  Patient Details  Name: Tyler Bowers MRN: [id number] Date of Birth: October 11, 1940  Subjective/Objective:     Pt.has VA benefits, [name]  Unit secretary is completing the packet with [name]. [facility]. Dispo to follow.               Action/Plan:   Expected Discharge Date:                  Expected Discharge Plan:     In-House Referral:     Discharge planning Services     Post Acute Care Choice:    Choice offered to:     DME Arranged:    DME Agency:     HH Arranged:    HH Agency:     Status of Service:     Medicare Important Message Given:    Date Medicare IM Given:    Medicare IM give by:    Date Additional Medicare IM Given:    Additional Medicare Important Message give by:     If discussed at Long Length of Stay Meetings, dates discussed:    Additional Comments:  Berna BueCheryl Darrelle Wiberg, RN 02/06/2015, 11:03 AM

## 2015-09-07 ENCOUNTER — Emergency Department: Payer: Medicare Other

## 2015-09-07 ENCOUNTER — Emergency Department
Admission: EM | Admit: 2015-09-07 | Discharge: 2015-09-07 | Disposition: A | Discharge status: Other Acute Inpatient Hospital | Payer: Medicare Other | Attending: Emergency Medicine | Admitting: Emergency Medicine

## 2015-09-07 ENCOUNTER — Encounter: Payer: Self-pay | Admitting: Emergency Medicine

## 2015-09-07 DIAGNOSIS — R42 Dizziness and giddiness: Secondary | ICD-10-CM | POA: Insufficient documentation

## 2015-09-07 DIAGNOSIS — E119 Type 2 diabetes mellitus without complications: Secondary | ICD-10-CM | POA: Insufficient documentation

## 2015-09-07 DIAGNOSIS — J159 Unspecified bacterial pneumonia: Secondary | ICD-10-CM | POA: Diagnosis not present

## 2015-09-07 DIAGNOSIS — I129 Hypertensive chronic kidney disease with stage 1 through stage 4 chronic kidney disease, or unspecified chronic kidney disease: Secondary | ICD-10-CM | POA: Diagnosis not present

## 2015-09-07 DIAGNOSIS — J189 Pneumonia, unspecified organism: Secondary | ICD-10-CM

## 2015-09-07 DIAGNOSIS — J441 Chronic obstructive pulmonary disease with (acute) exacerbation: Secondary | ICD-10-CM | POA: Insufficient documentation

## 2015-09-07 DIAGNOSIS — R0902 Hypoxemia: Secondary | ICD-10-CM | POA: Diagnosis not present

## 2015-09-07 DIAGNOSIS — N189 Chronic kidney disease, unspecified: Secondary | ICD-10-CM | POA: Diagnosis not present

## 2015-09-07 DIAGNOSIS — Z79899 Other long term (current) drug therapy: Secondary | ICD-10-CM | POA: Diagnosis not present

## 2015-09-07 DIAGNOSIS — R112 Nausea with vomiting, unspecified: Secondary | ICD-10-CM | POA: Diagnosis present

## 2015-09-07 DIAGNOSIS — Z7902 Long term (current) use of antithrombotics/antiplatelets: Secondary | ICD-10-CM | POA: Diagnosis not present

## 2015-09-07 DIAGNOSIS — R7989 Other specified abnormal findings of blood chemistry: Secondary | ICD-10-CM | POA: Diagnosis not present

## 2015-09-07 HISTORY — DX: Chronic kidney disease, unspecified: N18.9

## 2015-09-07 HISTORY — DX: Hyperlipidemia, unspecified: E78.5

## 2015-09-07 HISTORY — DX: Chronic obstructive pulmonary disease, unspecified: J44.9

## 2015-09-07 HISTORY — DX: Anemia, unspecified: D64.9

## 2015-09-07 HISTORY — DX: Peripheral vascular disease, unspecified: I73.9

## 2015-09-07 LAB — BASIC METABOLIC PANEL
ANION GAP: 11 (ref 5–15)
BUN: 43 mg/dL — ABNORMAL HIGH (ref 6–20)
CO2: 26 mmol/L (ref 22–32)
Calcium: 8.9 mg/dL (ref 8.9–10.3)
Chloride: 95 mmol/L — ABNORMAL LOW (ref 101–111)
Creatinine, Ser: 2.67 mg/dL — ABNORMAL HIGH (ref 0.61–1.24)
GFR calc Af Amer: 25 mL/min — ABNORMAL LOW (ref >60)
GFR, EST NON AFRICAN AMERICAN: 22 mL/min — AB (ref >60)
Glucose, Bld: 88 mg/dL (ref 65–99)
POTASSIUM: 4 mmol/L (ref 3.5–5.1)
Sodium: 132 mmol/L — ABNORMAL LOW (ref 135–145)

## 2015-09-07 LAB — URINALYSIS COMPLETE WITH MICROSCOPIC (ARMC ONLY)
Bacteria, UA: NONE SEEN
Bilirubin Urine: NEGATIVE
GLUCOSE, UA: NEGATIVE mg/dL
Hgb urine dipstick: NEGATIVE
Ketones, ur: NEGATIVE mg/dL
LEUKOCYTES UA: NEGATIVE
Nitrite: NEGATIVE
Protein, ur: 30 mg/dL — AB
SPECIFIC GRAVITY, URINE: 1.018 (ref 1.005–1.030)
pH: 5 (ref 5.0–8.0)

## 2015-09-07 LAB — CBC
HCT: 39 % — ABNORMAL LOW (ref 40.0–52.0)
Hemoglobin: 12.5 g/dL — ABNORMAL LOW (ref 13.0–18.0)
MCH: 28.9 pg (ref 26.0–34.0)
MCHC: 32.1 g/dL (ref 32.0–36.0)
MCV: 90 fL (ref 80.0–100.0)
PLATELETS: 216 10*3/uL (ref 150–440)
RBC: 4.33 MIL/uL — AB (ref 4.40–5.90)
RDW: 15.4 % — AB (ref 11.5–14.5)
WBC: 29.1 10*3/uL — ABNORMAL HIGH (ref 3.8–10.6)

## 2015-09-07 LAB — TROPONIN I
TROPONIN I: 0.21 ng/mL — AB (ref <0.031)
Troponin I: 0.23 ng/mL — ABNORMAL HIGH (ref <0.031)

## 2015-09-07 LAB — LACTIC ACID, PLASMA
LACTIC ACID, VENOUS: 1.4 mmol/L (ref 0.5–2.0)
LACTIC ACID, VENOUS: 1.4 mmol/L (ref 0.5–2.0)

## 2015-09-07 LAB — LIPASE, BLOOD: Lipase: 16 U/L (ref 11–51)

## 2015-09-07 LAB — GLUCOSE, CAPILLARY: GLUCOSE-CAPILLARY: 87 mg/dL (ref 65–99)

## 2015-09-07 MED ORDER — LEVOFLOXACIN IN D5W 750 MG/150ML IV SOLN
750.0000 mg | Freq: Once | INTRAVENOUS | Status: AC
  Administered 2015-09-07: 750 mg via INTRAVENOUS
  Filled 2015-09-07: qty 150

## 2015-09-07 MED ORDER — PIPERACILLIN-TAZOBACTAM 3.375 G IVPB
3.3750 g | Freq: Once | INTRAVENOUS | Status: AC
  Administered 2015-09-07: 3.375 g via INTRAVENOUS
  Filled 2015-09-07: qty 50

## 2015-09-07 MED ORDER — SODIUM CHLORIDE 0.9 % IV BOLUS (SEPSIS)
500.0000 mL | Freq: Once | INTRAVENOUS | Status: AC
  Administered 2015-09-07: 500 mL via INTRAVENOUS

## 2015-09-07 MED ORDER — IPRATROPIUM-ALBUTEROL 0.5-2.5 (3) MG/3ML IN SOLN
3.0000 mL | Freq: Once | RESPIRATORY_TRACT | Status: AC
  Administered 2015-09-07: 3 mL via RESPIRATORY_TRACT
  Filled 2015-09-07: qty 3

## 2015-09-07 NOTE — ED Notes (Signed)
Pt placed on oxygen via [place] at 2L. 92% at this time.

## 2015-09-07 NOTE — ED Provider Notes (Signed)
-----------------------------------------   8:09 PM on 09/07/2015 -----------------------------------------  [facility]. We will arrange transport as soon as possible.  Minna AntisKevin Danicka Hourihan, MD 09/07/15 2010

## 2015-09-07 NOTE — ED Provider Notes (Signed)
Time Seen: Approximately *1242 I have reviewed the triage notes  Chief Complaint: Dizziness and Emesis   History of Present Illness: Tyler Bowers is a 74 y.o. male he's had a recent onset of increasing shortness of breath, nausea, vomiting over the last 2 days. He states his nausea and vomiting have slowed those continues to feel some shortness of breath. Patient has a history of chronic interstitial lung disease. Currently not on any home oxygen therapy and arrives with a pulse ox of 85% on room air. Patient states he feels lightheaded without any associated syncopal episode. He denies any chest pain or abdominal pain at this time. Patient present in the outpatient clinic today very dizzy and lightheaded with positive orthostatic signs.  Past Medical History  Diagnosis Date  . Hypertension   . Diabetes mellitus without complication (HCC)   . Arthritis   . COPD (chronic obstructive pulmonary disease) (HCC)   . Anemia   . CKD (chronic kidney disease)   . Hyperlipemia   . PVD (peripheral vascular disease) (HCC)     There are no active problems to display for this patient.   Past Surgical History  Procedure Laterality Date  . Hip fusion    . Lumbar fusion      Past Surgical History  Procedure Laterality Date  . Hip fusion    . Lumbar fusion      Current Outpatient Rx  Name  Route  Sig  Dispense  Refill  . allopurinol (ZYLOPRIM) 100 MG tablet   Oral   Take 100 mg by mouth daily.         . carvedilol (COREG) 12.5 MG tablet   Oral   Take 12.5 mg by mouth 2 (two) times daily.         . cetirizine (ZYRTEC) 10 MG tablet   Oral   Take 10 mg by mouth at bedtime.         . chlorthalidone (HYGROTON) 50 MG tablet   Oral   Take 25 mg by mouth daily.         . clopidogrel (PLAVIX) 75 MG tablet   Oral   Take 75 mg by mouth daily.         Marland Kitchen. glipiZIDE (GLUCOTROL) 10 MG tablet   Oral   Take 10 mg by mouth 2 (two) times daily.         . hydrALAZINE  (APRESOLINE) 50 MG tablet   Oral   Take 50 mg by mouth 2 (two) times daily.         Marland Kitchen. ipratropium (ATROVENT) 0.03 % nasal spray   Each Nare   Place 2 sprays into both nostrils 4 (four) times daily as needed for rhinitis.         Marland Kitchen. loratadine (CLARITIN) 10 MG tablet   Oral   Take 10 mg by mouth every morning.         Marland Kitchen. omeprazole (PRILOSEC) 20 MG capsule   Oral   Take 20 mg by mouth daily.         Marland Kitchen. oxyCODONE-acetaminophen (ROXICET) 5-325 MG per tablet   Oral   Take 1 tablet by mouth every 6 (six) hours as needed.   20 tablet   0   . pravastatin (PRAVACHOL) 20 MG tablet   Oral   Take 20 mg by mouth at bedtime.         Marland Kitchen. terazosin (HYTRIN) 10 MG capsule   Oral   Take 10 mg by  mouth at bedtime.           Allergies:  Aspirin  Family History: History reviewed. No pertinent family history.  Social History: Social History  Substance Use Topics  . Smoking status: Never Smoker   . Smokeless tobacco: None  . Alcohol Use: No     Review of Systems:   10 point review of systems was performed and was otherwise negative:  Constitutional: No fever Eyes: No visual disturbances ENT: No sore throat, ear pain Cardiac: No chest pain Respiratory: Mild increased in shortness of breath without any productive cough or wheezing Abdomen: No abdominal pain, no vomiting, No diarrhea Endocrine: No weight loss, No night sweats Extremities: No peripheral edema, cyanosis Skin: No rashes, easy bruising Neurologic: No focal weakness, trouble with speech or swollowing Urologic: No dysuria, Hematuria, or urinary frequency   Physical Exam:  ED Triage Vitals  Enc Vitals Group     BP 09/07/15 1229 123/65 mmHg     Pulse Rate 09/07/15 1229 95     Resp 09/07/15 1229 22     Temp 09/07/15 1229 99.2 F (37.3 C)     Temp Source 09/07/15 1229 Oral     SpO2 09/07/15 1229 85 %     Weight 09/07/15 1229 194 lb (87.998 kg)     Height 09/07/15 1229  (1.803 m)     Head Cir --       Peak Flow --      Pain Score 09/07/15 1250 0     Pain Loc --      Pain Edu? --      Excl. in GC? --     General: Awake , Alert , and Oriented times 3; GCS 15 Head: Normal cephalic , atraumatic Eyes: Pupils equal , round, reactive to light Nose/Throat: No nasal drainage, patent upper airway without erythema or exudate.  Neck: Supple, Full range of motion, No anterior adenopathy or palpable thyroid masses Lungs: Clear to ascultation without wheezes , rhonchi, or rales Heart: Regular rate, regular rhythm without murmurs , gallops , or rubs Abdomen: Soft, non tender without rebound, guarding , or rigidity; bowel sounds positive and symmetric in all 4 quadrants. No organomegaly .        Extremities: 2 plus symmetric pulses. No edema, clubbing or cyanosis Neurologic: normal ambulation, Motor symmetric without deficits, sensory intact Skin: warm, dry, no rashes   Labs:   All laboratory work was reviewed including any pertinent negatives or positives listed below:  Labs Reviewed  BASIC METABOLIC PANEL - Abnormal; Notable for the following:    Sodium 132 (*)    Chloride 95 (*)    BUN 43 (*)    Creatinine, Ser 2.67 (*)    GFR calc non Af Amer 22 (*)    GFR calc Af Amer 25 (*)    All other components within normal limits  CBC - Abnormal; Notable for the following:    WBC 29.1 (*)    RBC 4.33 (*)    Hemoglobin 12.5 (*)    HCT 39.0 (*)    RDW 15.4 (*)    All other components within normal limits  TROPONIN I - Abnormal; Notable for the following:    Troponin I 0.23 (*)    All other components within normal limits  URINALYSIS COMPLETEWITH MICROSCOPIC ([facility] ONLY) - Abnormal; Notable for the following:    Color, Urine YELLOW (*)    APPearance CLEAR (*)    Protein, ur 30 (*)  Squamous Epithelial / LPF 0-5 (*)    All other components within normal limits  CULTURE, BLOOD (ROUTINE X 2)  CULTURE, BLOOD (ROUTINE X 2)  LIPASE, BLOOD  LACTIC ACID, PLASMA  LACTIC ACID, PLASMA   TROPONIN I  CBG MONITORING, ED   review of laboratory work shows numerous abnormal findings including elevated BUN and creatinine level. Elevated white blood cell count. Troponin is 0.23 which is elevated.  EKG:  ED ECG REPORT I, Jennye Moccasin, the attending physician, personally viewed and interpreted this ECG.  Date: 09/07/2015 EKG Time: *1240 Rate: 100 Rhythm: Sinus tachycardia with frequent PACs QRS Axis: normal Intervals: normal ST/T Wave abnormalities: normal Conduction Disutrbances: none Narrative Interpretation: unremarkable Nonspecific ST-T wave changes  EXAM: PORTABLE CHEST 1 VIEW  COMPARISON: PA and lateral chest x-ray of Jan 31, 2014  FINDINGS: The right hemidiaphragm remains higher than the left. The pulmonary interstitial markings remain increased and are more conspicuous today. Confluent alveolar opacity in the left infrahilar region has developed. There is no significant pleural effusion and there is no pneumothorax. The cardiac silhouette is top-normal in size. The central pulmonary vascularity is prominent.  IMPRESSION: Chronic pulmonary fibrotic change with superimposed infiltrate in the left lower lobe. There may be low-grade compensated CHF as well. When the patient can tolerate the procedure, a PA and lateral chest x-ray would be useful.   Electronically Signed By: David Swaziland M.D. On: 09/07/2015 13:09 *   I personally reviewed the radiologic studies     ED Course:  Patient's stay here was uneventful and the patient has a repeat troponin pending. Troponin may be elevated to significant dehydration. Patient was started on IV fluid resuscitation. He appears to have community-acquired pneumonia and was started on IV antibiotic therapy. Blood culture 2 and urine culture times one are pending.   Assessment: * Community-acquired pneumonia Dehydration Renal insufficiency Elevated troponin of unknown significance History of  interstitial lung disease Hypoxemia      Plan: Patient require inpatient management and reviewed with the patient shows that he is a [place] patient and requests transfer to the [facility] Appropriate paperwork has been filled out            Jennye Moccasin, MD 09/07/15 1452

## 2015-09-07 NOTE — ED Notes (Signed)
Critical result 0.23 troponin verbal to Dr Huel CoteQuigley.

## 2015-09-07 NOTE — ED Notes (Signed)
Pty taken out of ED in NAD by [facility].

## 2015-09-07 NOTE — ED Notes (Addendum)
Pt has had diarrhea and vomiting for past 2 days.  Been dizzy and lightheaded.  Went to [facility] and was sent for hypoxia and orthostatic hypotension.  Normal sats per pt 89-94. In triage ranging frm 83-89. Denies dyspnea at this time.

## 2015-09-12 LAB — CULTURE, BLOOD (ROUTINE X 2)
CULTURE: NO GROWTH
Culture: NO GROWTH

## 2015-12-15 DEATH — deceased

## 2016-12-12 IMAGING — CT CT HIP*L* W/O CM
2 of 3 series · 16 of 46 positions shown, 18 images · non-contrast
Comparison: Radiographs same date.

CLINICAL DATA: Severe left hip pain and limited weight-bearing
after falling today. History of left total hip replacement 4 years
ago. Initial encounter.

EXAM:
CT OF THE LEFT HIP WITHOUT CONTRAST
TECHNIQUE: Multidetector CT imaging of the left hip was performed according to
the standard protocol. Multiplanar CT image reconstructions were
also generated.

[Series 3: hip soft tissue · axial · 0.41mm/px · z∈[-277,-51]mm · 13 of 131 slices shown, 15 images]
[im 9/131  soft-tissue]
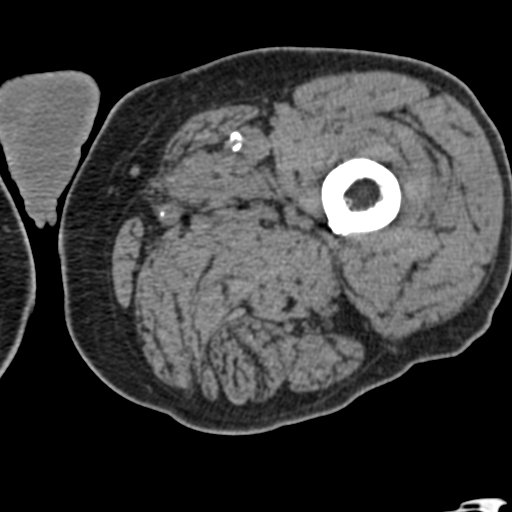
[im 9/131  bone]
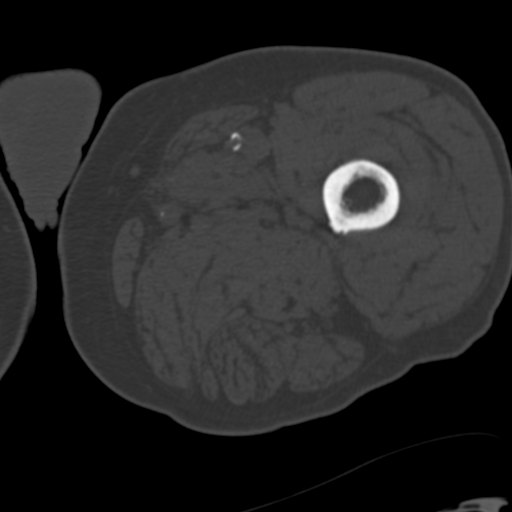
[im 17/131  soft-tissue]
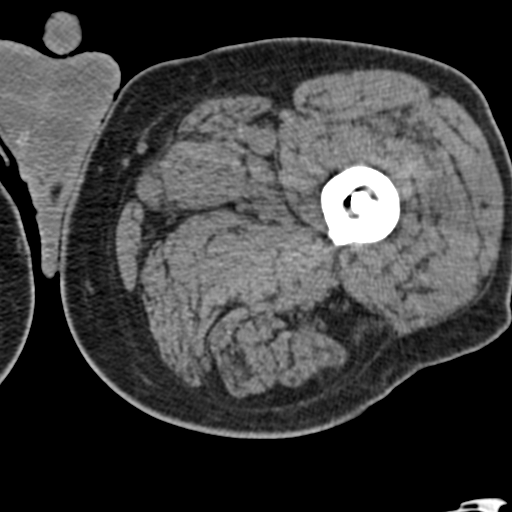
[im 26/131  soft-tissue]
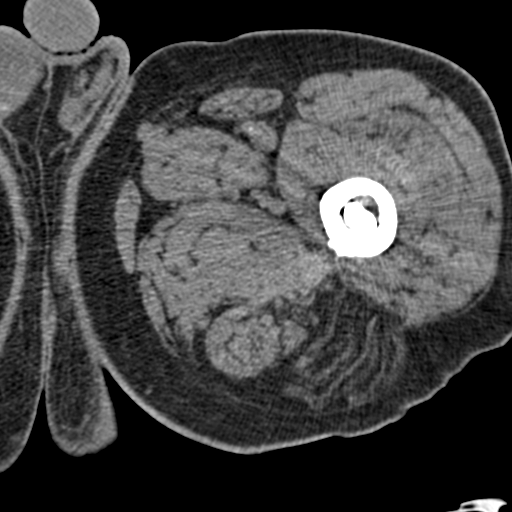
[im 34/131  soft-tissue]
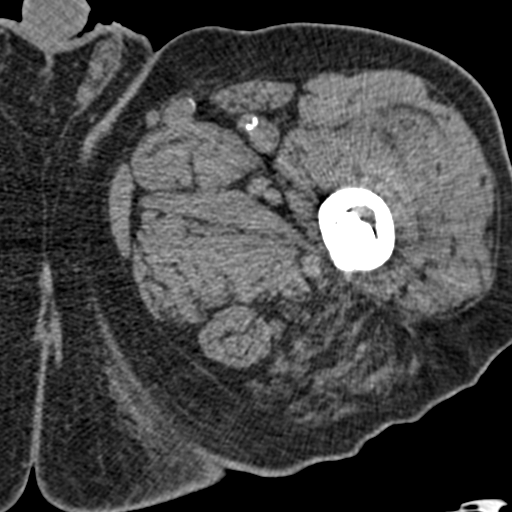
[im 47/131  soft-tissue]
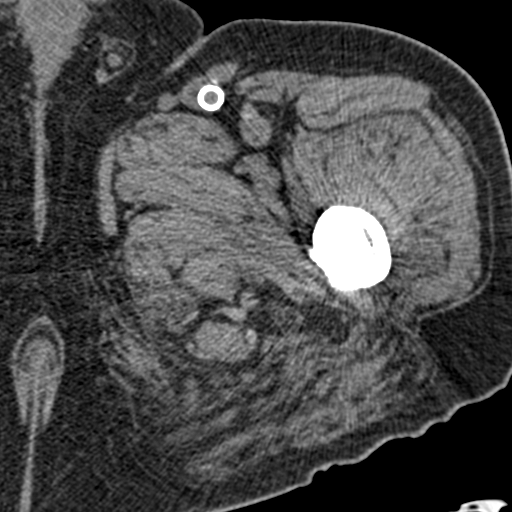
[im 55/131  soft-tissue]
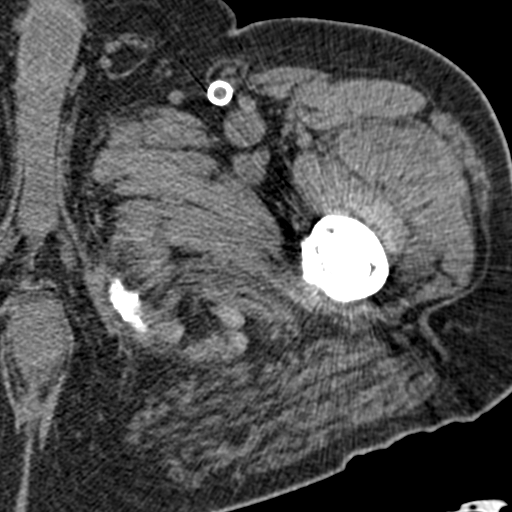
[im 63/131  soft-tissue]
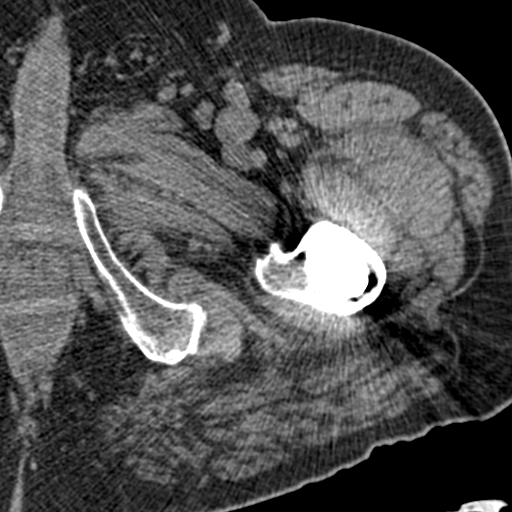
[im 72/131  soft-tissue]
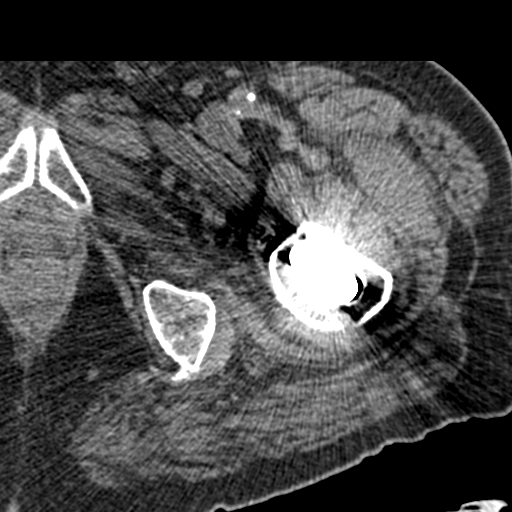
[im 80/131  soft-tissue]
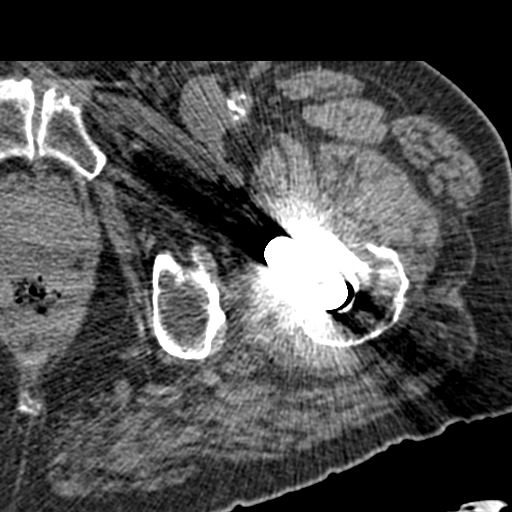
[im 80/131  bone]
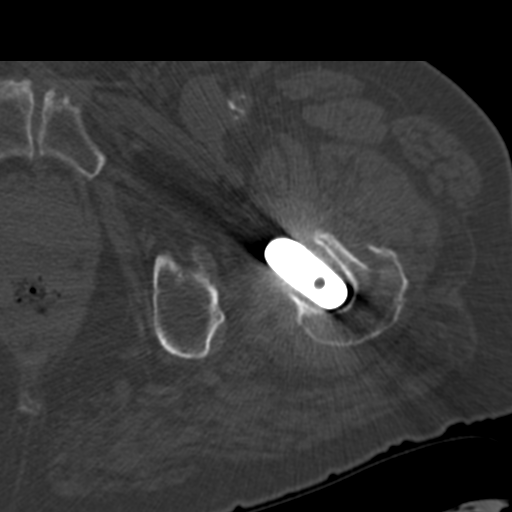
[im 97/131  soft-tissue]
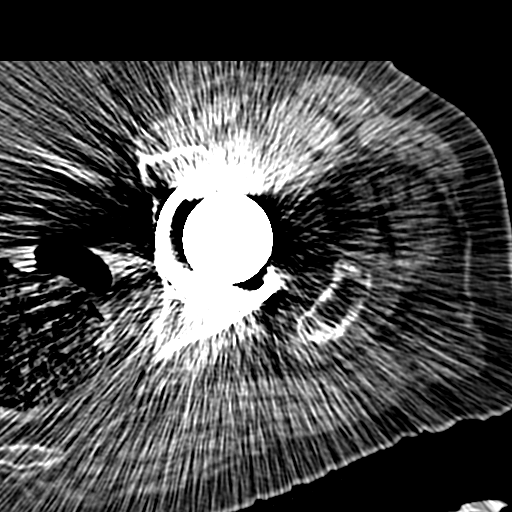
[im 105/131  soft-tissue]
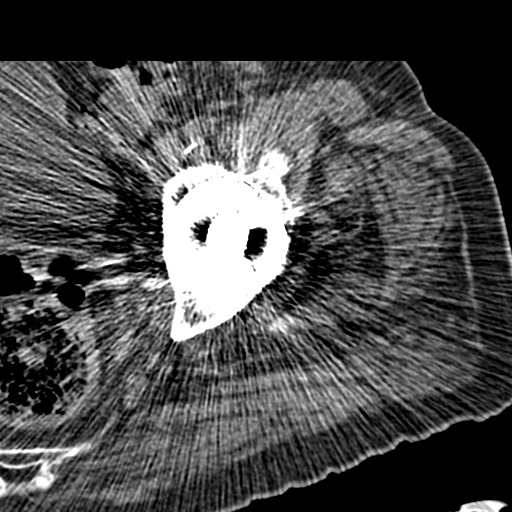
[im 114/131  soft-tissue]
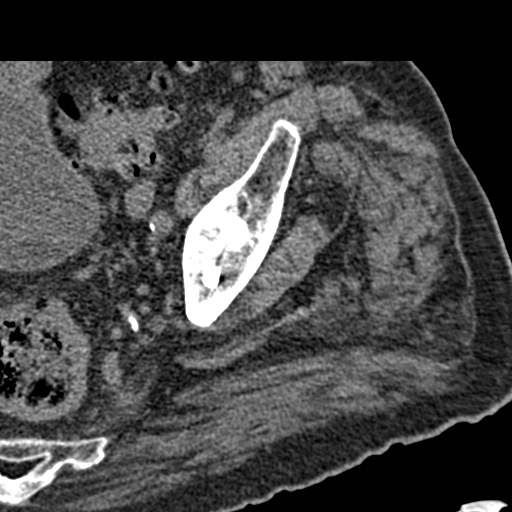
[im 122/131  soft-tissue]
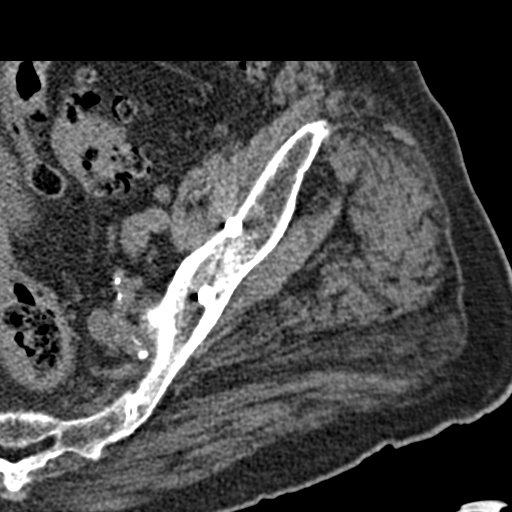

[Series 6: hip soft tissue cor · coronal · 0.43mm/px · 3 of 88 slices shown]
[im 30/88  soft-tissue]
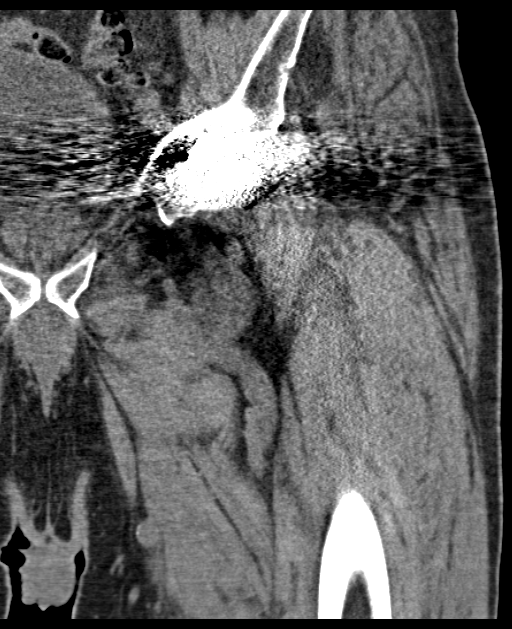
[im 39/88  soft-tissue]
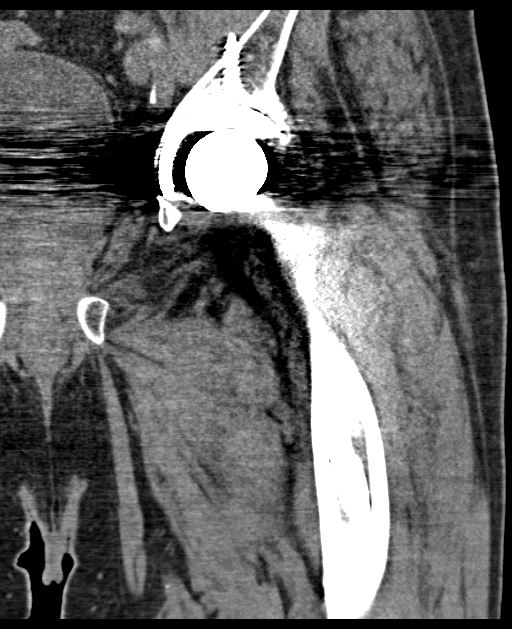
[im 49/88  soft-tissue]
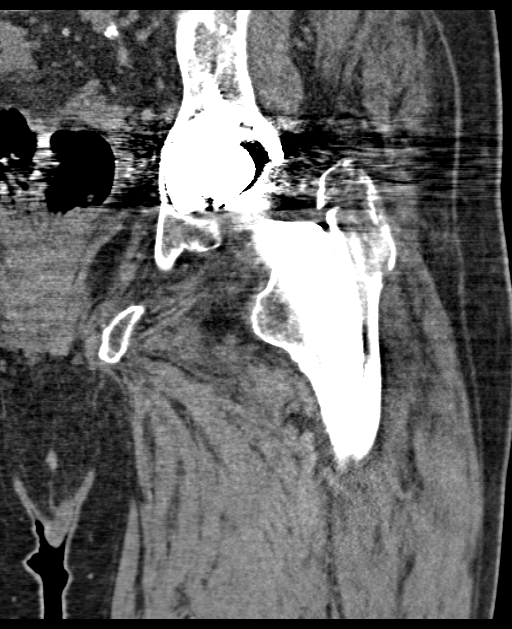

[16 of 46 positions shown; findings below may reference images not displayed]

FINDINGS: Examination is limited to the left hip and left hemipelvis. Patient
is status post left total hip arthroplasty. The acetabular component
is well seated without loosening. There is prominent lucency between
the proximal femoral prosthesis and the adjacent cortex, measuring
up to 5 mm in thickness. Distally, the femoral prosthesis is
well-seated.

There is an acute mildly displaced fracture of the greater
trochanter. This fracture demonstrates posterior intertrochanteric
extension, best seen on the sagittal images. No evidence of
dislocation or left pelvic fracture.

Diffuse vascular calcifications are noted. There is a vascular stent
within the left profunda femoral artery.
IMPRESSION: 1. Acute periprosthetic fracture adjacent to the femoral prosthesis
status post left total hip arthroplasty. The component extending
into the greater trochanter is mildly displaced.
2. Potential underlying loosening of the proximal aspect of the
femoral prosthesis. The prosthesis appears well seated distally.
3. Diffuse vascular calcifications.

## 2017-07-13 IMAGING — CR DG CHEST 1V PORT
1 series · 1 of 1 positions shown · non-contrast
Comparison: PA and lateral chest x-ray January 31, 2014

CLINICAL DATA: Filling bed for the past 2 days with difficulty
breathing; history of COPD and asbestos related lung disease, former
smoker.

EXAM:
PORTABLE CHEST 1 VIEW

[ap]
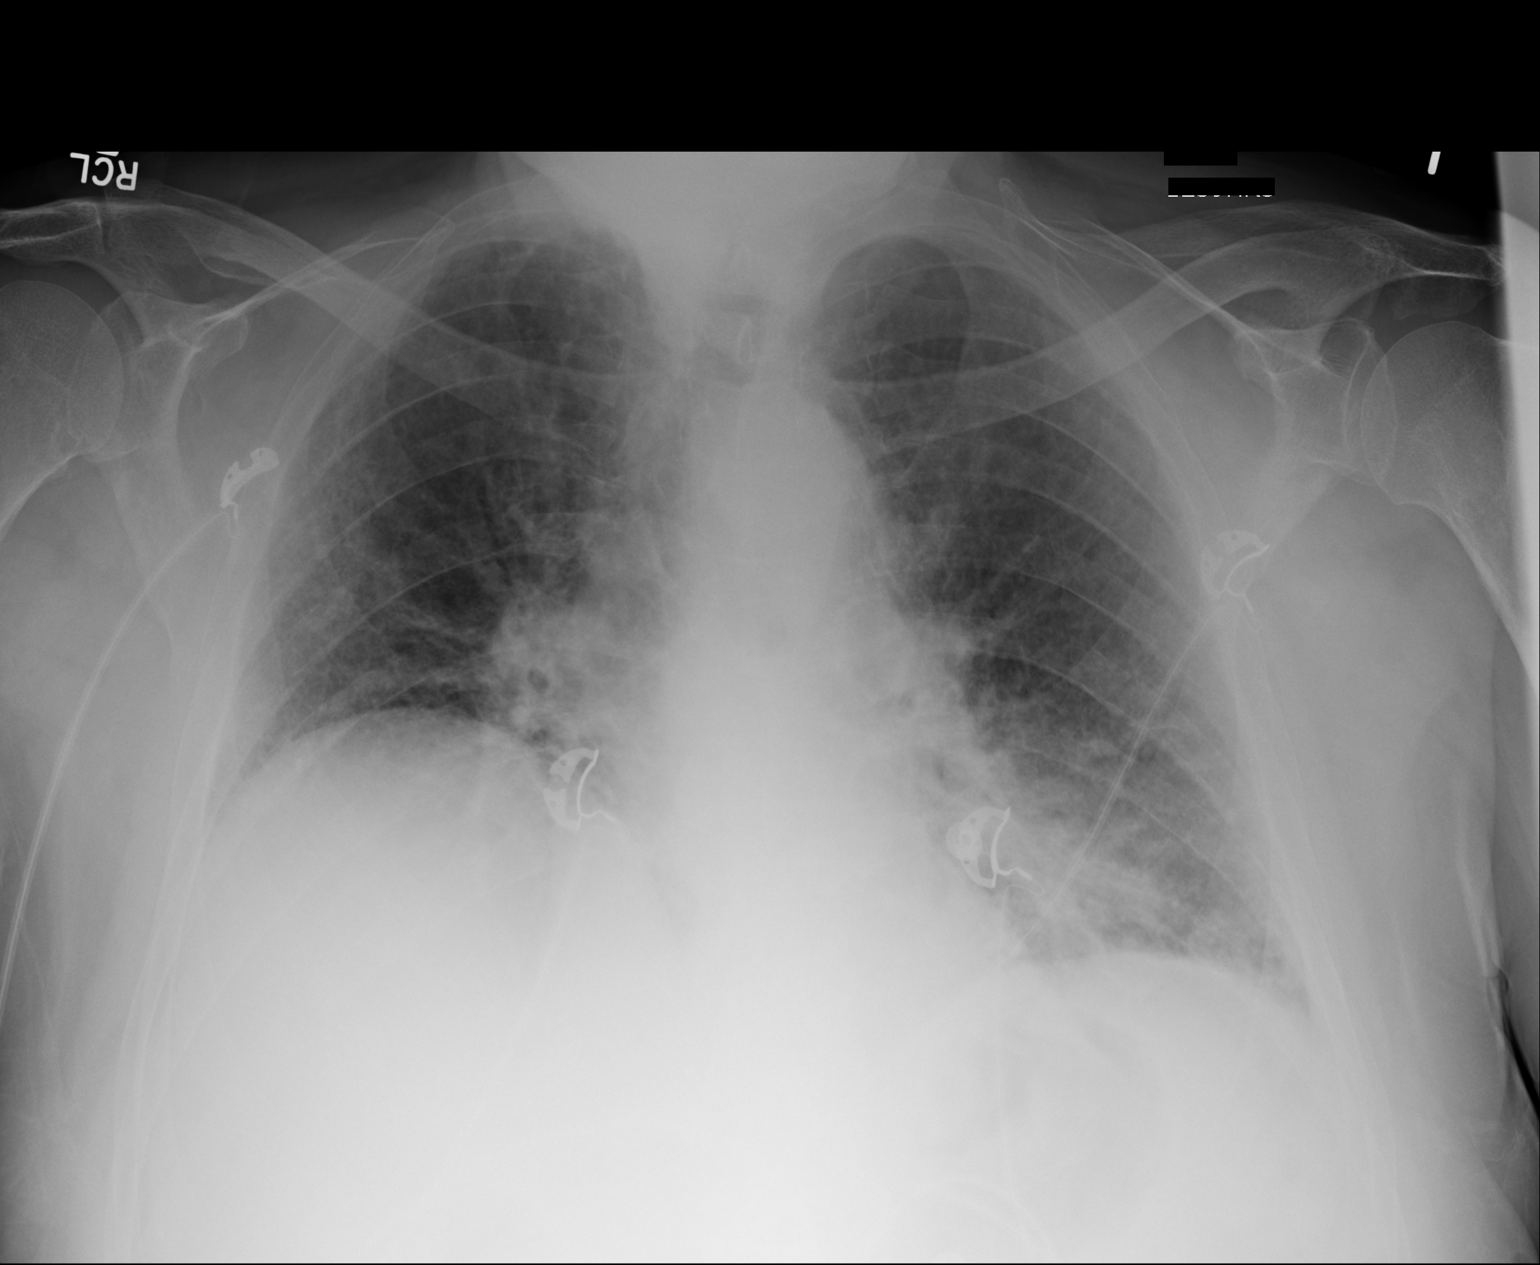

[1 of 1 positions shown; findings below may reference images not displayed]

FINDINGS: The right hemidiaphragm remains higher than the left. The pulmonary
interstitial markings remain increased and are more conspicuous
today. Confluent alveolar opacity in the left infrahilar region has
developed. There is no significant pleural effusion and there is no
pneumothorax. The cardiac silhouette is top-normal in size. The
central pulmonary vascularity is prominent.
IMPRESSION: Chronic pulmonary fibrotic change with superimposed infiltrate in
the left lower lobe. There may be low-grade compensated CHF as well.
When the patient can tolerate the procedure, a PA and lateral chest
x-ray would be useful.
